# Patient Record
Sex: Male | Born: 1945 | Race: White | Hispanic: No | Marital: Single | Smoking: Never smoker
Health system: Southern US, Community
[De-identification: ages and names within clinical notes are randomized; demographics above are authoritative.]

## PROBLEM LIST (undated history)

## (undated) HISTORY — PX: HERNIA REPAIR: SHX51

---

## 2008-08-27 ENCOUNTER — Ambulatory Visit: Payer: Self-pay | Admitting: Internal Medicine

## 2010-01-28 IMAGING — CR DG CLAVICLE*R*
1 series · 2 of 2 positions shown · non-contrast
Comparison: none

REASON FOR EXAM: 3 DAY HISTORY OF PAIN/SWELLING
COMMENTS:

PROCEDURE:     DXR - DXR CLAVICLE RIGHT  - August 27, 2008  [DATE]
RESULT:     There does not appear to be evidence of fracture, dislocation or
malalignment.

[Series 1: view not recorded · 0.17mm/px · 2 of 2 slices shown]
[im 1/2]
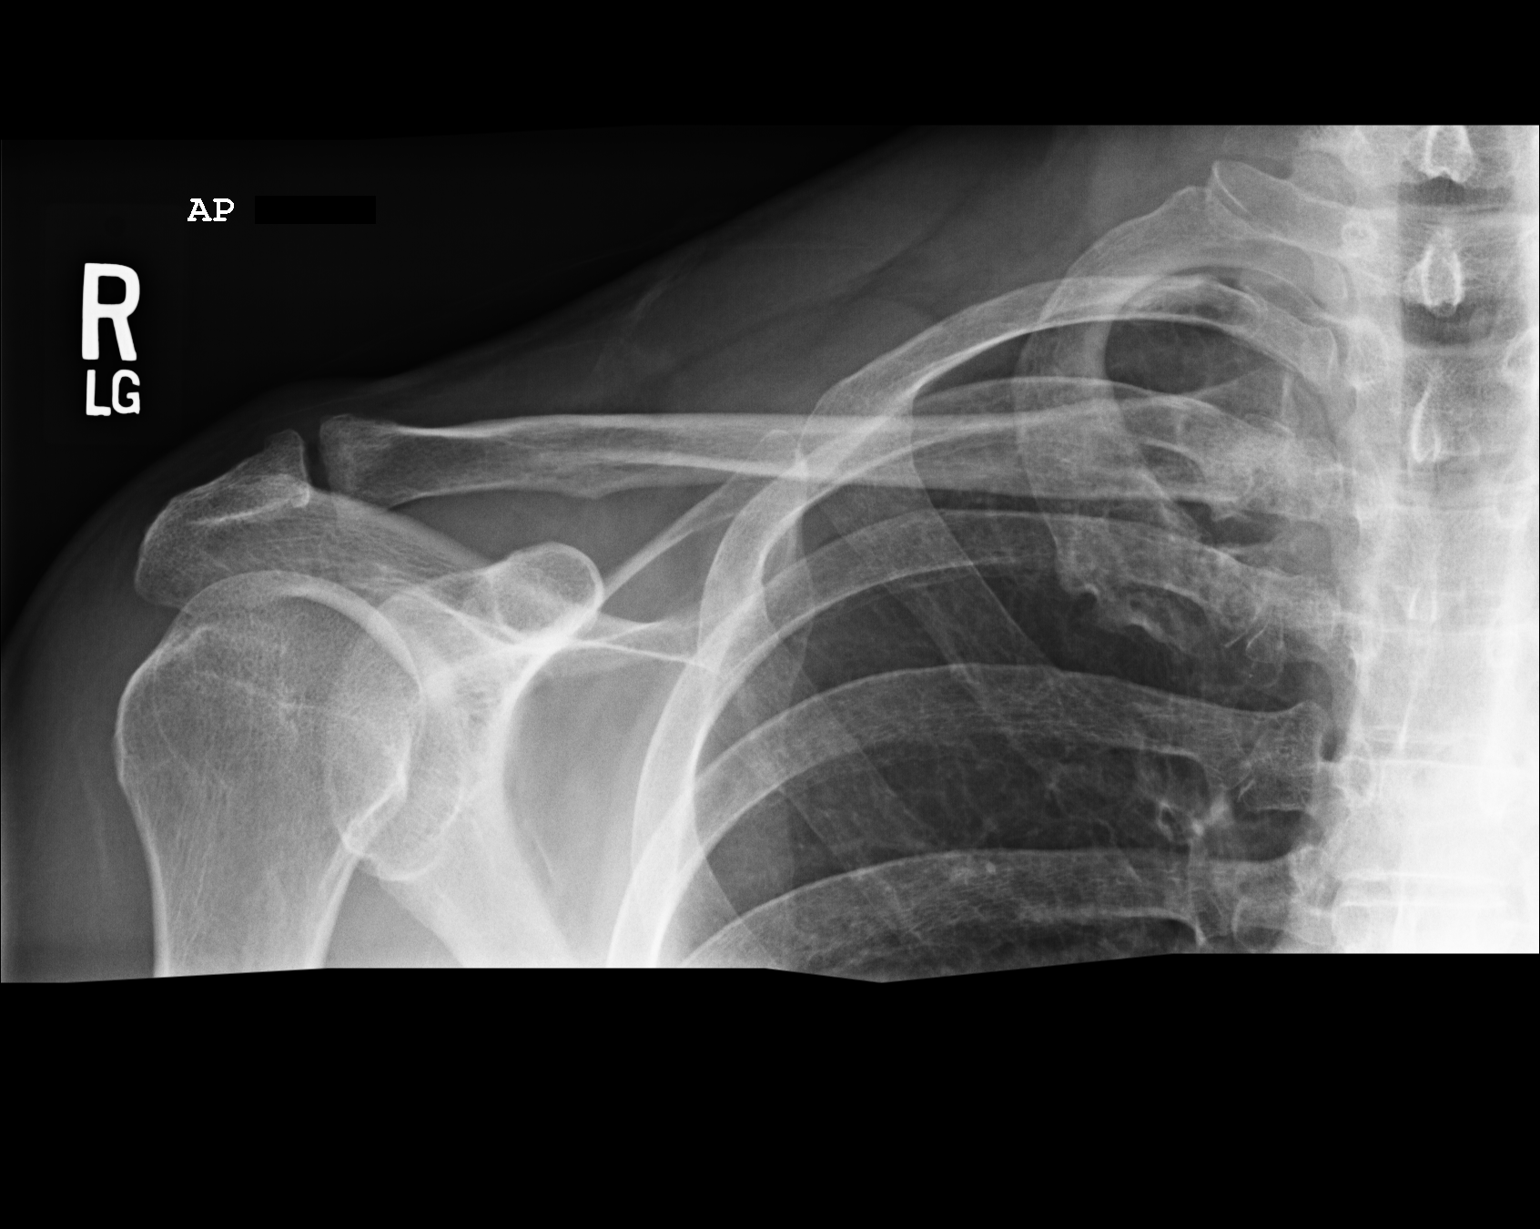
[im 2/2]
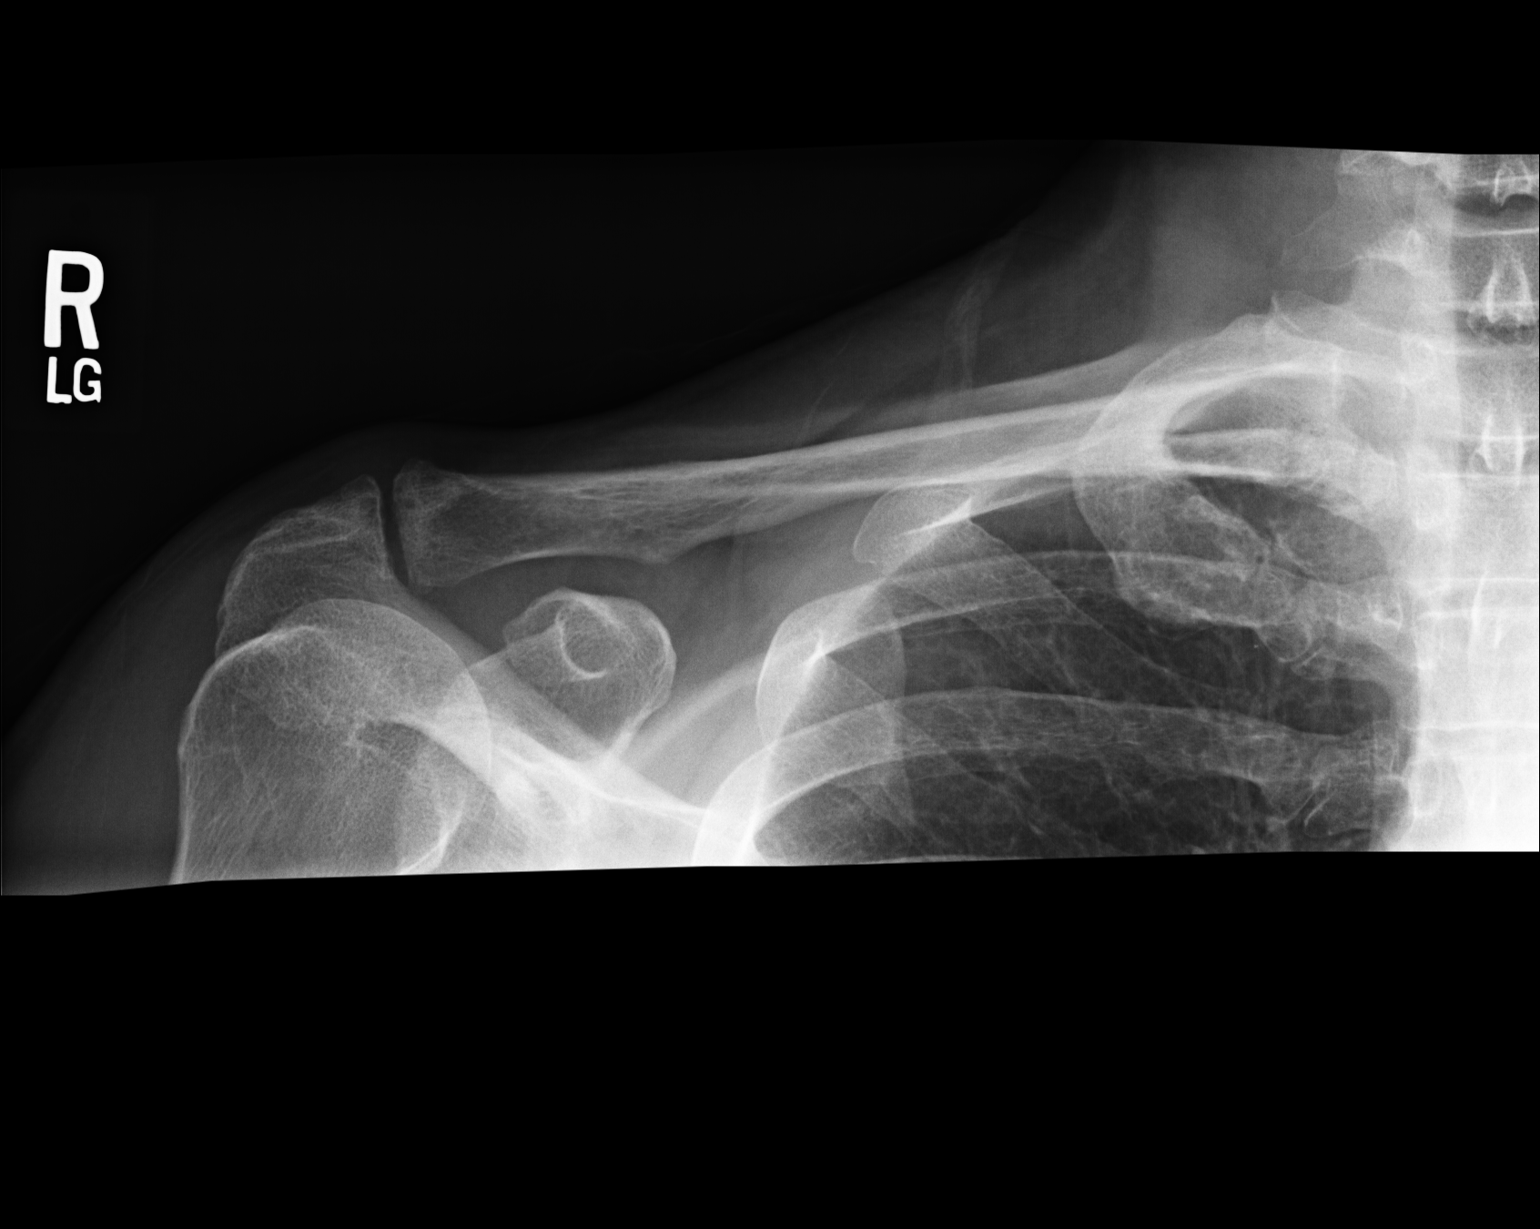

[2 of 2 positions shown; findings below may reference images not displayed]

IMPRESSION: 1.     No acute osseous abnormality.
2.     If there is persistent clinical concern or persistent complaints of
pain, repeat evaluation in 7-10 days is recommended, if clinically
warranted.

## 2012-04-30 ENCOUNTER — Encounter: Payer: Self-pay | Admitting: Emergency Medicine

## 2012-04-30 ENCOUNTER — Emergency Department
Admission: EM | Admit: 2012-04-30 | Discharge: 2012-04-30 | Disposition: A | Discharge status: Home / Self Care | Payer: Self-pay | Source: Home / Self Care | Attending: Family Medicine | Admitting: Family Medicine

## 2012-04-30 DIAGNOSIS — H6090 Unspecified otitis externa, unspecified ear: Secondary | ICD-10-CM

## 2012-04-30 DIAGNOSIS — R05 Cough: Secondary | ICD-10-CM

## 2012-04-30 DIAGNOSIS — H60399 Other infective otitis externa, unspecified ear: Secondary | ICD-10-CM

## 2012-04-30 DIAGNOSIS — J069 Acute upper respiratory infection, unspecified: Secondary | ICD-10-CM

## 2012-04-30 MED ORDER — HYDROCOD POLST-CHLORPHEN POLST 10-8 MG/5ML PO LQCR: 5.0000 mL | Freq: Two times a day (BID) | ORAL | Status: AC | PRN

## 2012-04-30 MED ORDER — NEOMYCIN-POLYMYXIN-HC 3.5-10000-1 OT SOLN: 3.0000 [drp] | Freq: Four times a day (QID) | OTIC | Status: AC

## 2012-04-30 MED ORDER — AZITHROMYCIN 250 MG PO TABS: ORAL_TABLET | ORAL | Status: AC

## 2012-04-30 NOTE — ED Provider Notes (Signed)
History     CSN: [id number]  Arrival date & time 04/30/12  4098   First MD Initiated Contact with Patient 04/30/12 0818      Chief Complaint  Patient presents with  . Cough   HPI  URI Symptoms Onset: 10 days  Description: rhinorrhea, nasal congestion, cough  Modifying factors:  None; has not had flu shot this year   Symptoms Nasal discharge: yes Fever: no Sore throat: no Cough: yes Wheezing: no Ear pain: no GI symptoms: no Sick contacts: yes  Red Flags  Stiff neck: no Dyspnea: no Rash: no Swallowing difficulty: no  Sinusitis Risk Factors Headache/face pain: no Double sickening: no tooth pain: no  Allergy Risk Factors Sneezing: no Itchy scratchy throat: no Seasonal symptoms: no  Flu Risk Factors Headache: no muscle aches: no severe fatigue: no   History reviewed. No pertinent past medical history.  Past Surgical History  Procedure Date  . Hernia repair     No family history on file.  History  Substance Use Topics  . Smoking status: Never Smoker   . Smokeless tobacco: Not on file  . Alcohol Use: Yes    OB History    Grav Para Term Preterm Abortions TAB SAB Ect Mult Living                  Review of Systems  All other systems reviewed and are negative.    Allergies  Penicillins  Home Medications   Current Outpatient Rx  Name  Route  Sig  Dispense  Refill  . AZITHROMYCIN 250 MG PO TABS      Take 2 tabs PO x 1 dose, then 1 tab PO QD x 4 days   6 tablet   0   . HYDROCOD POLST-CPM POLST ER 10-8 MG/5ML PO LQCR   Oral   Take 5 mLs by mouth every 12 (twelve) hours as needed (cough).   60 mL   0     BP 158/93  Pulse 80  Temp 98.6 F (37 C) (Oral)  Resp 16  Ht 5\' 10"  (1.778 m)  Wt 200 lb (90.719 kg)  BMI 28.70 kg/m2  SpO2 100%  Physical Exam  Constitutional: He appears well-developed and well-nourished.  HENT:  Head: Normocephalic and atraumatic.       Bilateral ear canal erythema and tenderness to otoscopic  evaluation  +nasal erythema, rhinorrhea bilaterally, + post oropharyngeal erythema    Eyes: Conjunctivae normal are normal. Pupils are equal, round, and reactive to light.  Neck: Normal range of motion. Neck supple.  Cardiovascular: Normal rate, regular rhythm and normal heart sounds.   Pulmonary/Chest: Effort normal and breath sounds normal.  Abdominal: Soft. Bowel sounds are normal.  Musculoskeletal: Normal range of motion.  Lymphadenopathy:    He has no cervical adenopathy.  Neurological: He is alert.  Skin: Skin is warm.    ED Course  Procedures (including critical care time)  Labs Reviewed - No data to display No results found.   1. URI (upper respiratory infection)   2. Cough   3. Otitis externa       MDM  Likely viral vs. Post viral cough.  Will place on zpak for atypical coverage given duration of sxs. Cortisporin for bilateral otitis  Discussed supportive care and infectious red flags as well need for flu shot once symptomatically improved.  Pt expressed understanding.      The patient and/or caregiver has been counseled thoroughly with regard to treatment plan and/or medications  prescribed including dosage, schedule, interactions, rationale for use, and possible side effects and they verbalize understanding. Diagnoses and expected course of recovery discussed and will return if not improved as expected or if the condition worsens. Patient and/or caregiver verbalized understanding.             Doree Albee, MD 04/30/12 [phone]

## 2012-04-30 NOTE — ED Notes (Signed)
Productive cough x 10 days

## 2022-05-09 ENCOUNTER — Other Ambulatory Visit: Payer: Self-pay

## 2022-05-09 ENCOUNTER — Encounter: Payer: Self-pay | Admitting: Emergency Medicine

## 2022-05-09 ENCOUNTER — Emergency Department: Payer: Medicare PPO

## 2022-05-09 ENCOUNTER — Emergency Department (HOSPITAL_COMMUNITY)
Admission: EM | Admit: 2022-05-09 | Discharge: 2022-05-10 | Disposition: A | Discharge status: Home / Self Care | Payer: Medicare PPO | Attending: Emergency Medicine | Admitting: Emergency Medicine

## 2022-05-09 ENCOUNTER — Emergency Department
Admission: EM | Admit: 2022-05-09 | Discharge: 2022-05-09 | Disposition: A | Discharge status: Home / Self Care | Payer: Medicare PPO | Attending: Emergency Medicine | Admitting: Emergency Medicine

## 2022-05-09 DIAGNOSIS — R42 Dizziness and giddiness: Secondary | ICD-10-CM | POA: Diagnosis not present

## 2022-05-09 DIAGNOSIS — Z1152 Encounter for screening for COVID-19: Secondary | ICD-10-CM | POA: Insufficient documentation

## 2022-05-09 DIAGNOSIS — M62838 Other muscle spasm: Secondary | ICD-10-CM

## 2022-05-09 DIAGNOSIS — R531 Weakness: Secondary | ICD-10-CM | POA: Diagnosis not present

## 2022-05-09 DIAGNOSIS — M4802 Spinal stenosis, cervical region: Secondary | ICD-10-CM | POA: Insufficient documentation

## 2022-05-09 DIAGNOSIS — G253 Myoclonus: Secondary | ICD-10-CM

## 2022-05-09 DIAGNOSIS — M79602 Pain in left arm: Secondary | ICD-10-CM | POA: Diagnosis present

## 2022-05-09 LAB — CBC
HCT: 43.3 % (ref 39.0–52.0)
Hemoglobin: 15.2 g/dL (ref 13.0–17.0)
MCH: 31.9 pg (ref 26.0–34.0)
MCHC: 35.1 g/dL (ref 30.0–36.0)
MCV: 90.8 fL (ref 80.0–100.0)
Platelets: 161 10*3/uL (ref 150–400)
RBC: 4.77 MIL/uL (ref 4.22–5.81)
RDW: 11.9 % (ref 11.5–15.5)
WBC: 6 10*3/uL (ref 4.0–10.5)
nRBC: 0 % (ref 0.0–0.2)

## 2022-05-09 LAB — RESP PANEL BY RT-PCR (FLU A&B, COVID) ARPGX2
Influenza A by PCR: NEGATIVE
Influenza B by PCR: NEGATIVE
SARS Coronavirus 2 by RT PCR: NEGATIVE

## 2022-05-09 LAB — BASIC METABOLIC PANEL
Anion gap: 8 (ref 5–15)
BUN: 12 mg/dL (ref 8–23)
CO2: 23 mmol/L (ref 22–32)
Calcium: 9 mg/dL (ref 8.9–10.3)
Chloride: 101 mmol/L (ref 98–111)
Creatinine, Ser: 0.9 mg/dL (ref 0.61–1.24)
GFR, Estimated: >60 mL/min (ref >60)
Glucose, Bld: 132 mg/dL — ABNORMAL HIGH (ref 70–99)
Potassium: 4 mmol/L (ref 3.5–5.1)
Sodium: 132 mmol/L — ABNORMAL LOW (ref 135–145)

## 2022-05-09 LAB — URINALYSIS, ROUTINE W REFLEX MICROSCOPIC
Bilirubin Urine: NEGATIVE
Glucose, UA: NEGATIVE mg/dL
Hgb urine dipstick: NEGATIVE
Ketones, ur: NEGATIVE mg/dL
Leukocytes,Ua: NEGATIVE
Nitrite: NEGATIVE
Protein, ur: NEGATIVE mg/dL
Specific Gravity, Urine: 1.01 (ref 1.005–1.030)
pH: 7 (ref 5.0–8.0)

## 2022-05-09 MED ORDER — SODIUM CHLORIDE 0.9 % IV BOLUS
500.0000 mL | Freq: Once | INTRAVENOUS | Status: AC
  Administered 2022-05-09: 500 mL via INTRAVENOUS

## 2022-05-09 NOTE — ED Notes (Signed)
Pt transported to MRI

## 2022-05-09 NOTE — Discharge Instructions (Addendum)
You were evaluation today seems reassuring.  It is important that if you have symptoms that worsen or you develop any severe concerns that you return to the ER right away.  You should definitely come back and see [place] right away if you develop any numbness, weakness in an arm or leg or your face, severe headache, high fevers, passout, feel more weak, develop confusion or other concerns arise  Please follow-up with your physician in [place] upon your return home.  Should the symptoms persist I would also recommend you may wish to follow-up with the neurologist

## 2022-05-09 NOTE — ED Triage Notes (Signed)
Patient seen at [facility] ER this morning for generalized weakness , discharge home , reports tremors at left side of body with intermittent involuntary left arm flexion this evening .

## 2022-05-09 NOTE — ED Notes (Signed)
Pt given dinner tray.

## 2022-05-09 NOTE — ED Provider Notes (Signed)
Patient family called me back and reported that he started to develop tremulousness in his left arm and left leg once he got home.  I discussed with them and advised them to return for evaluation to an emergency department.  They have driven him to [facility] to be seen and reevaluated now which is quite reasonable.  I called and discussed with the on-call ED physician and gave my report of today's workup at our ER, and the patient will be seen and evaluated for further workup at the [facility], ED this evening. Patient currently at [facility] ED wait room.      Delman Kitten, MD 05/09/22 2318

## 2022-05-09 NOTE — ED Provider Notes (Signed)
[facility] Provider Note    Event Date/Time   First MD Initiated Contact with Patient 05/09/22 1645     (approximate)   History   Weakness   HPI  Patrick Hubbard is a 77 y.o. male reports no major medical history.  In good health takes no prescription medications no allergies  He is traveling here from [place] has been here helping family.  This morning when he woke up he felt sort of weak, and light headed.  It said the persistent feeling throughout the day, 1 thing he has noticed though is that at times his left hand is just felt a little odd, and when he goes to write with it just seems like his handwriting is slightly off.  He is left-handed.  No headaches no fevers no chills no chest pain no abdominal pain.  He was in his normal health when he went to bed last night probably around about 11 PM.  He thinks he got up to urinate once but does not know what time.  His daughter-in-law is a Engineer, civil (consulting), and she checked his orthostatics at home as he felt lightheaded and they were normal at the house  Right now he feels okay except he just feels fatigued and a little bit weak all over.     Physical Exam   Triage Vital Signs: ED Triage Vitals  Enc Vitals Group     BP 05/09/22 1625 (!) 150/89     Pulse Rate 05/09/22 1625 78     Resp 05/09/22 1625 18     Temp 05/09/22 1625 98 F (36.7 C)     Temp Source 05/09/22 1625 Oral     SpO2 05/09/22 1625 99 %     Weight --      Height --      Head Circumference --      Peak Flow --      Pain Score 05/09/22 1623 0     Pain Loc --      Pain Edu? --      Excl. in GC? --     Most recent vital signs: Vitals:   05/09/22 1625 05/09/22 1905  BP: (!) 150/89 127/85  Pulse: 78 82  Resp: 18 14  Temp: 98 F (36.7 C)   SpO2: 99% 98%     General: Awake, no distress.  Normocephalic atraumatic.  NIH stroke score is 0, though it is hard to perceive but there may be some very slight ataxia in the left hand but I  would score it is a 0 at this point as is extremely subtle and I would not of noticed it should the patient not have mentioned some difficulty with handwriting CV:  Good peripheral perfusion.  Normal rate and tones Resp:  Normal effort.  Clear bilaterally Abd:  No distention.  Soft nontender nondistended.  Denies any nausea vomiting diarrhea or other abdominal symptoms Other:  Neurologic exam performed NIH stroke scale, no obvious deficits though there is a questionable slight ataxia in the left hand though very difficult to perceive it.    ED Results / Procedures / Treatments   Labs (all labs ordered are listed, but only abnormal results are displayed) Labs Reviewed  BASIC METABOLIC PANEL - Abnormal; Notable for the following components:      Result Value   Sodium 132 (*)    Glucose, Bld 132 (*)    All other components within normal limits  URINALYSIS, ROUTINE W REFLEX MICROSCOPIC -  Abnormal; Notable for the following components:   Color, Urine YELLOW (*)    APPearance CLEAR (*)    All other components within normal limits  RESP PANEL BY RT-PCR (FLU A&B, COVID) ARPGX2  CBC  CBG MONITORING, ED     EKG  Is interpreted by me at 1630 heart rate 80 QRS 100 QTc 450 Normal sinus rhythm no evidence of ischemia or ectopy   RADIOLOGY    I reviewed his MRI and interpreted it for gross abnormality, I do not see evidence of any large stroke or obvious mass lesion   MR BRAIN WO CONTRAST  Result Date: 05/09/2022 CLINICAL DATA:  Generalized weakness with left leg weakness EXAM: MRI HEAD WITHOUT CONTRAST TECHNIQUE: Multiplanar, multiecho pulse sequences of the brain and surrounding structures were obtained without intravenous contrast. COMPARISON:  None Available. FINDINGS: Brain: There is no acute intracranial hemorrhage, extra-axial fluid collection, or acute infarct Parenchymal volume is normal. The ventricles are normal in size. Parenchymal signal is normal. There is no significant  burden of underlying chronic small-vessel ischemic change. The pituitary and suprasellar region are normal. There is no mass lesion. There is no mass effect or midline shift. Vascular: Normal flow voids. Skull and upper cervical spine: Normal marrow signal. Sinuses/Orbits: The paranasal sinuses are clear. The globes and orbits are unremarkable. Other: None. IMPRESSION: Normal brain MRI. Electronically Signed   By: Valetta Mole M.D.   On: 05/09/2022 18:42      MRI brain ordered without contrast to evaluate for mass lesion, stroke or potential central neurologic cause  PROCEDURES:  Critical Care performed: No  Procedures   MEDICATIONS ORDERED IN ED: Medications  sodium chloride 0.9 % bolus 500 mL (500 mLs Intravenous New Bag/Given 05/09/22 1733)     IMPRESSION / MDM / ASSESSMENT AND PLAN / ED COURSE  I reviewed the triage vital signs and the nursing notes.                              Differential diagnosis includes, but is not limited to, possible early viral syndrome, perhaps mild dehydration, potential mild hyponatremia though no baseline sodium is noted for this patient he does not give a symptomatology or history that would be highly suggestive of volume depletion.  CBC normal.  He complains of generalized feeling of fatigue and perhaps some slight difficulty with his left hand, thus order MRI to exclude central causation though he seems to present more symptoms of generalized fatigue and weakness.  He is alert oriented nontoxic well-appearing on exam.  Able to sit up on his own without difficulty.  Pleasant and conversant  No urinary symptoms.  No obvious infectious symptoms no cardiac or pulmonary symptoms.  Patient's presentation is most consistent with acute complicated illness / injury requiring diagnostic workup.    ----------------------------------------- 8:14 PM on 05/09/2022 ----------------------------------------- Patient feeling improved, able to walk independently  back-and-forth the distance of the hallway.  Does report he is starting to feel a bit better.  Still feels just a little bit fatigued or slightly weak, but has no focal deficits.  He and his family are very agreeable with the plan to be discharged and follow-up with careful return precautions.  I did advise he may follow-up here in our area, but as he plans to return to [place] he may follow-up with his doctor there.  In the interim if he has any concerns or issues he will return emergency department.  Return precautions and treatment recommendations and follow-up discussed with the patient who is agreeable with the plan.       FINAL CLINICAL IMPRESSION(S) / ED DIAGNOSES   Final diagnoses:  Generalized weakness     Rx / DC Orders   ED Discharge Orders     None        Note:  This document was prepared using Dragon voice recognition software and may include unintentional dictation errors.   Delman Kitten, MD 05/09/22 2015

## 2022-05-09 NOTE — ED Provider Triage Note (Signed)
Emergency Medicine Provider Triage Evaluation Note  Patrick Hubbard , a 77 y.o. male  was evaluated in triage.  Pt complains of left upper extremity tremulousness and weakness.  Seems this morning he had generalized weakness and had some dysmetria of left upper extremity on neuroexam with Dr. Legrand Rams of emergency medicine at [facility].  I called Dr. Legrand Rams to verify story.  He had MRI brain that was without any evidence of stroke.  He was sent home but seems to have some tremors in left upper extremity and was told to return to ER.  He came to [facility].    Review of Systems  Positive: LUE weakness, LUE tremors  Negative: Fever  Physical Exam  BP 131/82 (BP Location: Right Arm)   Pulse 71   Temp 97.9 F (36.6 C) (Oral)   Resp 16   SpO2 96%  Gen:   Awake, no distress   Resp:  Normal effort  MSK:   Moves extremities without difficulty  Other:    Fairly unremarkable neurologic exam.  Is walking without difficulty.  Smile symmetric, shrug symmetric, perhaps small amount of left upper extremity weakness with elbow flexion.  Grip seems grossly normal.  Bilateral patellar reflexes normal.  Medical Decision Making  Medically screening exam initiated at 11:59 PM.  Appropriate orders placed.  DIAMANTE RUBIN was informed that the remainder of the evaluation will be completed by another provider, this initial triage assessment does not replace that evaluation, and the importance of remaining in the ED until their evaluation is complete.  MRI C-spine.   Patrick Hubbard, [place] 05/10/22 0004

## 2022-05-09 NOTE — ED Triage Notes (Addendum)
Pt to ED via POV from home. Pt accompanied by daughter. Pt is from out of town visiting and has been feeling off. Pt reports woke up this morning woke up with moderate weakness. No sick contacts. Pt neurologically intact. Some mild weakness noted to left leg.

## 2022-05-10 ENCOUNTER — Emergency Department (HOSPITAL_COMMUNITY): Payer: Medicare PPO

## 2022-05-10 LAB — BASIC METABOLIC PANEL
Anion gap: 9 (ref 5–15)
BUN: 9 mg/dL (ref 8–23)
CO2: 22 mmol/L (ref 22–32)
Calcium: 9.1 mg/dL (ref 8.9–10.3)
Chloride: 101 mmol/L (ref 98–111)
Creatinine, Ser: 0.91 mg/dL (ref 0.61–1.24)
GFR, Estimated: >60 mL/min (ref >60)
Glucose, Bld: 110 mg/dL — ABNORMAL HIGH (ref 70–99)
Potassium: 4.1 mmol/L (ref 3.5–5.1)
Sodium: 132 mmol/L — ABNORMAL LOW (ref 135–145)

## 2022-05-10 LAB — CK: Total CK: 168 U/L (ref 49–397)

## 2022-05-10 LAB — MAGNESIUM: Magnesium: 2.2 mg/dL (ref 1.7–2.4)

## 2022-05-10 LAB — FOLATE: Folate: 23.5 ng/mL (ref >5.9)

## 2022-05-10 LAB — VITAMIN B12: Vitamin B-12: 886 pg/mL (ref 180–914)

## 2022-05-10 MED ORDER — LORAZEPAM 1 MG PO TABS: 0.5000 mg | ORAL_TABLET | Freq: Every day | ORAL | 0 refills | Status: DC | PRN

## 2022-05-10 MED ORDER — LORAZEPAM 1 MG PO TABS: 0.5000 mg | ORAL_TABLET | Freq: Every day | ORAL | 0 refills | Status: AC

## 2022-05-10 MED ORDER — LORAZEPAM 1 MG PO TABS
0.5000 mg | ORAL_TABLET | Freq: Once | ORAL | Status: AC
  Administered 2022-05-10: 0.5 mg via ORAL
  Filled 2022-05-10: qty 1

## 2022-05-10 MED ORDER — SODIUM CHLORIDE 0.9 % IV BOLUS
1000.0000 mL | Freq: Once | INTRAVENOUS | Status: AC
  Administered 2022-05-10: 1000 mL via INTRAVENOUS

## 2022-05-10 NOTE — Discharge Instructions (Signed)
You were seen for your muscle spasms and contractions in the emergency department.  Your lab work is reassuring.  At home, please take the Ativan to sleep at night.    Check your MyChart online for the results of any tests that had not resulted by the time you left the emergency department.   Follow-up with your primary doctor in 2-3 days regarding your visit.  Follow-up with Dr Lorrin Goodell from neurology this week regarding your symptoms.  Return immediately to the emergency department if you experience any of the following: Seizure-like activity, loss of consciousness, or any other concerning symptoms.    Thank you for visiting our Emergency Department. It was a pleasure taking care of you today.

## 2022-05-10 NOTE — ED Provider Notes (Signed)
[facility] EMERGENCY DEPARTMENT Provider Note   CSN: [id number] Arrival date & time: 05/09/22  2308     History  Chief Complaint  Patient presents with   Tremors / Left arm Involuntary flexion     Patrick Hubbard is a 77 y.o. male.  77 year old male presents ER today secondary to involuntary motion of his left arm.  Patient was seen at [facility] earlier tonight for generalized weakness.  This started when he woke up in the morning.  He had bilateral lower extremity weakness throughout the day he noticed that he could not write as well with his left hand as he normally does and had some gait differences.  He went there and on their exam he had dysmetria with his left hand otherwise a relatively normal neurologic exam.  MRI was done was negative.  His labs were done which were reassuring to give him some fluids and seem to be close to baseline she was discharged home.  When he got home he had episodes of his bilateral thighs contracting along with his left arm involuntarily flexing at the elbow.  They called the previous provider back who recommended him being reseen for concern of a possible focal seizure.  They elected to come here as we have neurology on-call.  The abnormal movements have stopped.  He still feels a little bit off and generally weak and his daughter is with him who is a Marine scientist and states that his gait seems to be abnormal like he is stilted but cannot otherwise put her finger on what that means.    Home Medications Prior to Admission medications   Medication Sig Start Date End Date Taking? Authorizing Provider  azithromycin (ZITHROMAX) 250 MG tablet Take 2 tabs PO x 1 dose, then 1 tab PO QD x 4 days 04/30/12   Deneise Lever, MD  chlorpheniramine-HYDROcodone [facility] PENNKINETIC ER) 10-8 MG/5ML LQCR Take 5 mLs by mouth every 12 (twelve) hours as needed (cough). 04/30/12   Deneise Lever, MD      Allergies    Penicillins    Review of  Systems   Review of Systems  Physical Exam Updated Vital Signs BP 119/74 (BP Location: Right Arm)   Pulse 74   Temp 97.9 F (36.6 C) (Oral)   Resp 16   SpO2 100%  Physical Exam Vitals and nursing note reviewed.  Constitutional:      Appearance: He is well-developed.  HENT:     Head: Normocephalic and atraumatic.  Eyes:     Pupils: Pupils are equal, round, and reactive to light.  Cardiovascular:     Rate and Rhythm: Normal rate.  Pulmonary:     Effort: Pulmonary effort is normal. No respiratory distress.  Abdominal:     General: There is no distension.  Musculoskeletal:        General: Normal range of motion.     Cervical back: Normal range of motion.  Neurological:     Mental Status: He is alert.     Comments: Pupils equal round reactive to light, extraocular movements intact, eyebrow raise symmetric, forehead wrinkling symmetric, smile and symmetric, tongue protrusion midline and uvula raises midline, shoulder shrug is symmetric and normal.  He has normal arm flexion and extension without any evidence of clonus or tremors.  He has symmetric and normal dorsiflexion/plantarflexion and 10-second leg raise on both sides.  He has normal and symmetric Achilles, patellar and bicep tendon reflexes.  He ambulates without any obvious  ataxia or imbalance or weakness although does seem to be a little bit hesitant.  No shuffling gait.     ED Results / Procedures / Treatments   Labs (all labs ordered are listed, but only abnormal results are displayed) Labs Reviewed  BASIC METABOLIC PANEL  MAGNESIUM  VITAMIN B12  FOLATE  CK    EKG None  Radiology MR Cervical Spine Wo Contrast  Result Date: 05/10/2022 CLINICAL DATA:  Left upper extremity weakness EXAM: MRI CERVICAL SPINE WITHOUT CONTRAST TECHNIQUE: Multiplanar, multisequence MR imaging of the cervical spine was performed. No intravenous contrast was administered. COMPARISON:  None Available. FINDINGS: Alignment: Trace  retrolisthesis of C3 on C4. Preservation of the normal cervical lordosis. Vertebrae: No fracture, evidence of discitis, or bone lesion. Cord: Normal signal and morphology. Posterior Fossa, vertebral arteries, paraspinal tissues: Negative. Disc levels: C2-C3: No significant disc bulge. No spinal canal stenosis or neuroforaminal narrowing. C3-C4: Trace retrolisthesis and minimal disc bulge. Facet and uncovertebral hypertrophy. Mild to moderate spinal canal stenosis. Severe left and mild-to-moderate right neural foraminal narrowing. C4-C5: Minimal disc bulge. Facet and uncovertebral hypertrophy. Mild spinal canal stenosis and mild bilateral neural foraminal narrowing. C5-C6: Mild disc bulge. Facet and uncovertebral hypertrophy. Mild spinal canal stenosis. Severe right and moderate to severe left neural foraminal narrowing. C6-C7: Minimal disc bulge. No spinal canal stenosis or neural foraminal narrowing. C7-T1: No significant disc bulge. No spinal canal stenosis or neuroforaminal narrowing. IMPRESSION: 1. 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 2. C5-C6 mild spinal canal stenosis with severe right and moderate to severe left neural foraminal narrowing. 3. C4-C5 mild spinal canal stenosis and mild bilateral neural foraminal narrowing. Electronically Signed   By: Merilyn Baba M.D.   On: 05/10/2022 01:38   MR BRAIN WO CONTRAST  Result Date: 05/09/2022 CLINICAL DATA:  Generalized weakness with left leg weakness EXAM: MRI HEAD WITHOUT CONTRAST TECHNIQUE: Multiplanar, multiecho pulse sequences of the brain and surrounding structures were obtained without intravenous contrast. COMPARISON:  None Available. FINDINGS: Brain: There is no acute intracranial hemorrhage, extra-axial fluid collection, or acute infarct Parenchymal volume is normal. The ventricles are normal in size. Parenchymal signal is normal. There is no significant burden of  underlying chronic small-vessel ischemic change. The pituitary and suprasellar region are normal. There is no mass lesion. There is no mass effect or midline shift. Vascular: Normal flow voids. Skull and upper cervical spine: Normal marrow signal. Sinuses/Orbits: The paranasal sinuses are clear. The globes and orbits are unremarkable. Other: None. IMPRESSION: Normal brain MRI. Electronically Signed   By: Valetta Mole M.D.   On: 05/09/2022 18:42    Procedures Procedures    Medications Ordered in ED Medications  LORazepam (ATIVAN) tablet 0.5 mg (has no administration in time range)  sodium chloride 0.9 % bolus 1,000 mL (has no administration in time range)    ED Course/ Medical Decision Making/ A&P Clinical Course as of 05/11/22 0103  Thu May 10, 2022  0708 Assumed care from Dr Dolly Rias. 77 yo M previously healthy woke up Wednesday morning with generalized weakness and weakness in his lower extremities. Had L hand difficulty with writing and walking. Went to [facility] with dysmetria on exam but normal MRI. Was dc'd home and started having L arm contractions and BL LE twitching in his thighs. Called ED from [facility] back who was concerned about focal seizures and referred into the ED. Consulted Dr Lorrin Goodell from neurology who feels that if workup is negative here can  fu as an outpatient.  [RP]  0900 Patient reassessed and was doing well.  No more severe muscle cramps.  They are requesting that he have a medication to help with his leg spasms and cramps at night so was given a prescription of Ativan to take at home.  Will have him follow-up with neurology as an outpatient since his labs today are reassuring.  Return precautions discussed with the patient and his daughter at the time of discharge.  Also instructed him to follow-up with his primary doctor in 2 to 3 days. [RP]    Clinical Course User Index [RP] Rondel Baton, MD                             Medical Decision Making Amount and/or  Complexity of Data Reviewed Labs: ordered.  Risk Prescription drug management.   MRI done in triage shows some spinal stenosis but does not really explain his acute onset of symptoms.  Consider Parkinson's, Guillain-Barr, MS however with no lesions on MRI, rapid onset and preserved reflexes I did doubt MS or Guillain-Barr.  Parkinson's also not very likely with just sided tremors and once again in acute onset without any other real neurologic findings and normal gait.  Discussed with neurology who did not feel that there is any need for an acute neurologic consultation at this time.  Recommend adding on a couple labs.  His sodium was a little bit low earlier tonight at 132 but is not really low enough to be symptomatic we will going give him a normal saline bolus and recheck it.  Overall it is unclear what might be going on.  He probably does need a neurologic workup as an outpatient per neurology recommendations if his labs are normal.  Care transferred pending labs and recheck.  Final Clinical Impression(s) / ED Diagnoses Final diagnoses:  None    Rx / DC Orders ED Discharge Orders     None         Demetric Parslow, Barbara Cower, MD 05/11/22 [phone]

## 2022-05-10 NOTE — ED Provider Notes (Signed)
  Physical Exam  BP 119/74 (BP Location: Right Arm)   Pulse 74   Temp 97.9 F (36.6 C) (Oral)   Resp 16   SpO2 100%   Physical Exam  Procedures  Procedures  ED Course / MDM   Clinical Course as of 05/10/22 1805  Thu May 10, 2022  0708 Assumed care from Dr Dolly Rias. 77 yo M previously healthy woke up Wednesday morning with generalized weakness and weakness in his lower extremities. Had L hand difficulty with writing and walking. Went to [facility] with dysmetria on exam but normal MRI. Was dc'd home and started having L arm contractions and BL LE twitching in his thighs. Called ED from [facility] back who was concerned about focal seizures and referred into the ED. Consulted Dr Lorrin Goodell from neurology who feels that if workup is negative here can fu as an outpatient.  [RP]  0900 Patient reassessed and was doing well.  No more severe muscle cramps.  They are requesting that he have a medication to help with his leg spasms and cramps at night so was given a prescription of Ativan to take at home.  Will have him follow-up with neurology as an outpatient since his labs today are reassuring.  Return precautions discussed with the patient and his daughter at the time of discharge.  Also instructed him to follow-up with his primary doctor in 2 to 3 days. [RP]    Clinical Course User Index [RP] Fransico Meadow, MD   Medical Decision Making Amount and/or Complexity of Data Reviewed Labs: ordered.  Risk Prescription drug management.     Fransico Meadow, MD 05/10/22 [phone]

## 2022-05-11 ENCOUNTER — Ambulatory Visit: Payer: Medicare PPO | Admitting: Neurology

## 2022-05-11 ENCOUNTER — Encounter: Payer: Self-pay | Admitting: Neurology

## 2022-05-11 VITALS — BP 146/87 | HR 74 | Ht 70.0 in | Wt 190.5 lb

## 2022-05-11 DIAGNOSIS — R531 Weakness: Secondary | ICD-10-CM

## 2022-05-11 DIAGNOSIS — E871 Hypo-osmolality and hyponatremia: Secondary | ICD-10-CM

## 2022-05-11 DIAGNOSIS — F419 Anxiety disorder, unspecified: Secondary | ICD-10-CM | POA: Diagnosis not present

## 2022-05-11 NOTE — Progress Notes (Signed)
[facility]  PATIENT: Patrick Hubbard DOB: August 08, 1945  REQUESTING CLINICIAN: No ref. provider found HISTORY FROM: Patient and chart review  REASON FOR VISIT: General weakness   HISTORICAL  CHIEF COMPLAINT:  Chief Complaint  Patient presents with   Room 12    Pt is here with his Wife. Pt states that his tremors started 05/09/2022. Pt states that he has weakness in the morning.. Pt states that his tremors is In his left hand. Pt's wife states that he has muscle spasms in his legs at night.      HISTORY OF PRESENT ILLNESS:  This is a 77 year old gentleman with no reported past medical history who is presenting after being discharged from the hospital for generalized weakness. Patient reports 2 days ago he woke up feeling very weak, felt like he was going to pass out. The weakness did not improved for the rest of the day so he presented to the ED.  In the ED his workup was negative including a brain MRI.  He was found to have a low sodium of 132.  He was discharged home but at home was having some jerk like movements in the lower extremities and his left hand was drawing up, therefore we presented to the ED. Again his evaluation was nonfocal, his neurological examination was intact and his MRI cervical spine did not show any acute abnormalities.  He was discharged home with outpatient follow-up.  Since discharge, he reported improvement of his symptoms.  He feels better today and did not understand why he had the symptoms.  He never experienced something like this before. He does report that his wife passed of brain cancer 16 years ago.  He is sometimes concerned or scared that he might have brain cancer if he had any symptoms.  He is aware there is some underlying anxiety going on. He was sent home with Ativan.     OTHER MEDICAL CONDITIONS: No reported past medical history    REVIEW OF SYSTEMS: Full 14 system review of systems performed and negative with exception  of: As noted in the HPI   ALLERGIES: Allergies  Allergen Reactions   Penicillins     HOME MEDICATIONS: Outpatient Medications Prior to Visit  Medication Sig Dispense Refill   LORazepam (ATIVAN) 1 MG tablet Take 0.5 tablets (0.5 mg total) by mouth at bedtime for 15 days. 7.5 tablet 0   azithromycin (ZITHROMAX) 250 MG tablet Take 2 tabs PO x 1 dose, then 1 tab PO QD x 4 days (Patient not taking: Reported on 05/11/2022) 6 tablet 0   chlorpheniramine-HYDROcodone (TUSSIONEX PENNKINETIC ER) 10-8 MG/5ML LQCR Take 5 mLs by mouth every 12 (twelve) hours as needed (cough). (Patient not taking: Reported on 05/11/2022) 60 mL 0   No facility-administered medications prior to visit.    PAST MEDICAL HISTORY: History reviewed. No pertinent past medical history.  PAST SURGICAL HISTORY: Past Surgical History:  Procedure Laterality Date   HERNIA REPAIR      FAMILY HISTORY: History reviewed. No pertinent family history.  SOCIAL HISTORY: Social History   Socioeconomic History   Marital status: Single    Spouse name: Not on file   Number of children: Not on file   Years of education: Not on file   Highest education level: Not on file  Occupational History   Not on file  Tobacco Use   Smoking status: Never   Smokeless tobacco: Not on file  Substance and Sexual Activity   Alcohol use: Yes  Drug use: Not on file   Sexual activity: Not on file  Other Topics Concern   Not on file  Social History Narrative   Not on file   Social Determinants of Health   Financial Resource Strain: Not on file  Food Insecurity: Not on file  Transportation Needs: Not on file  Physical Activity: Not on file  Stress: Not on file  Social Connections: Not on file  Intimate Partner Violence: Not on file    PHYSICAL EXAM  GENERAL EXAM/CONSTITUTIONAL: Vitals:  Vitals:   05/11/22 0929  BP: (!) 146/87  Pulse: 74  Weight: 190 lb 8 oz (86.4 kg)  Height: 5\' 10"  (1.778 m)   Body mass index is 27.33  kg/m. Wt Readings from Last 3 Encounters:  05/11/22 190 lb 8 oz (86.4 kg)  04/30/12 200 lb (90.7 kg)   Patient is in no distress; well developed, nourished and groomed; neck is supple. Tearful and emotional crying during part of the interview.  EYES: Visual fields full to confrontation, Extraocular movements intacts,   MUSCULOSKELETAL: Gait, strength, tone, movements noted in Neurologic exam below  NEUROLOGIC: MENTAL STATUS:      No data to display         awake, alert, oriented to person, place and time, emotional and tearful during evaluation recent and remote memory intact normal attention and concentration language fluent, comprehension intact, naming intact fund of knowledge appropriate  CRANIAL NERVE:  2nd, 3rd, 4th, 6th - Visual fields full to confrontation, extraocular muscles intact, no nystagmus 5th - facial sensation symmetric 7th - facial strength symmetric 8th - hearing intact 9th - palate elevates symmetrically, uvula midline 11th - shoulder shrug symmetric 12th - tongue protrusion midline  MOTOR:  normal bulk and tone, full strength in the BUE, BLE  SENSORY:  normal and symmetric to light touch  COORDINATION:  finger-nose-finger, fine finger movements normal  REFLEXES:  deep tendon reflexes present and symmetric  GAIT/STATION:  Normal, able to tandem    DIAGNOSTIC DATA (LABS, IMAGING, TESTING) - I reviewed patient records, labs, notes, testing and imaging myself where available.  Lab Results  Component Value Date   WBC 6.0 05/09/2022   HGB 15.2 05/09/2022   HCT 43.3 05/09/2022   MCV 90.8 05/09/2022   PLT 161 05/09/2022      Component Value Date/Time   NA 132 (L) 05/10/2022 0657   K 4.1 05/10/2022 0657   CL 101 05/10/2022 0657   CO2 22 05/10/2022 0657   GLUCOSE 110 (H) 05/10/2022 0657   BUN 9 05/10/2022 0657   CREATININE 0.91 05/10/2022 0657   CALCIUM 9.1 05/10/2022 0657   GFRNONAA >60 05/10/2022 0657   No results found for:  "CHOL", "HDL", "LDLCALC", "LDLDIRECT", "TRIG", "CHOLHDL" No results found for: "HGBA1C" Lab Results  Component Value Date   VITAMINB12 886 05/10/2022   No results found for: "TSH"  MRI Brain 05/09/2022 Normal brain MRI.   MRI Cervical spine 05/10/2022 1. 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 2. C5-C6 mild spinal canal stenosis with severe right and moderate to severe left neural foraminal narrowing. 3. C4-C5 mild spinal canal stenosis and mild bilateral neural foraminal narrowing   ASSESSMENT AND PLAN  77 y.o. year old male with no reported past medical history who is presenting after being seen in the hospital for an episode of generalized weakness.  His workup including MRI brain and cervical spine was normal. He was noted to have a low  sodium of 132.  Other than that his neurological examination is nonfocal.  Today he reports improvement of his symptoms.  I will complete the workup by obtaining the TSH.  I will contact the patient to go over the result.  I did advise him to set up care with a PCP.  Return as needed.   1. Weakness   2. Anxiety   3. Hyponatremia      Patient Instructions  Continue current medications  Will check TSH  Set up care with a primary care physician  Return as needed   Orders Placed This Encounter  Procedures   TSH    No orders of the defined types were placed in this encounter.   Return if symptoms worsen or fail to improve.  I have spent a total of 50 minutes dedicated to this patient today, preparing to see patient, performing a medically appropriate examination and evaluation, ordering tests and/or medications and procedures, and counseling and educating the patient/family/caregiver; independently interpreting result and communicating results to the family/patient/caregiver; and documenting clinical information in the electronic medical record.   Alric Ran, MD  05/11/2022, 10:24 AM  [facility] [address], [place], [place] [address] [phone]

## 2022-05-11 NOTE — Patient Instructions (Addendum)
Continue current medications  Will check TSH  Set up care with a primary care physician  Return as needed

## 2022-05-12 LAB — TSH: TSH: 1.57 u[IU]/mL (ref 0.450–4.500)
